# Patient Record
Sex: Female | Born: 2000 | Race: White | Hispanic: No | Marital: Single | Smoking: Never smoker
Health system: Southern US, Community
[De-identification: ages and names within clinical notes are randomized; demographics above are authoritative.]

## PROBLEM LIST (undated history)

## (undated) DIAGNOSIS — B2790 Infectious mononucleosis, unspecified without complication: Secondary | ICD-10-CM

## (undated) DIAGNOSIS — J302 Other seasonal allergic rhinitis: Secondary | ICD-10-CM

## (undated) HISTORY — PX: TONSILLECTOMY: SUR1361

## (undated) HISTORY — DX: Other seasonal allergic rhinitis: J30.2

## (undated) HISTORY — DX: Infectious mononucleosis, unspecified without complication: B27.90

---
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
## 2019-02-18 ENCOUNTER — Encounter: Payer: Self-pay | Admitting: Family Medicine

## 2019-02-18 MED ORDER — CLINDAMYCIN PHOS-BENZOYL PEROX 1-5 % EX GEL: CUTANEOUS | 1 refills | Status: AC

## 2019-04-23 DIAGNOSIS — H6121 Impacted cerumen, right ear: Secondary | ICD-10-CM | POA: Diagnosis not present

## 2019-04-23 DIAGNOSIS — H9191 Unspecified hearing loss, right ear: Secondary | ICD-10-CM | POA: Diagnosis not present

## 2019-06-20 ENCOUNTER — Encounter: Payer: Self-pay | Admitting: Family Medicine

## 2019-07-16 ENCOUNTER — Other Ambulatory Visit: Payer: Self-pay

## 2019-07-17 ENCOUNTER — Encounter: Payer: BLUE CROSS/BLUE SHIELD | Admitting: Family Medicine

## 2019-08-04 ENCOUNTER — Ambulatory Visit: Payer: BC Managed Care – PPO | Attending: Internal Medicine

## 2019-08-04 DIAGNOSIS — Z20822 Contact with and (suspected) exposure to covid-19: Secondary | ICD-10-CM

## 2019-08-07 LAB — NOVEL CORONAVIRUS, NAA: SARS-CoV-2, NAA: NOT DETECTED

## 2019-08-20 ENCOUNTER — Encounter: Payer: Self-pay | Admitting: Family Medicine

## 2020-02-25 DIAGNOSIS — Z0000 Encounter for general adult medical examination without abnormal findings: Secondary | ICD-10-CM | POA: Diagnosis not present

## 2020-04-02 DIAGNOSIS — Z20828 Contact with and (suspected) exposure to other viral communicable diseases: Secondary | ICD-10-CM | POA: Diagnosis not present

## 2020-04-12 DIAGNOSIS — Z20828 Contact with and (suspected) exposure to other viral communicable diseases: Secondary | ICD-10-CM | POA: Diagnosis not present

## 2020-04-15 DIAGNOSIS — L700 Acne vulgaris: Secondary | ICD-10-CM | POA: Diagnosis not present

## 2020-04-15 DIAGNOSIS — Z79899 Other long term (current) drug therapy: Secondary | ICD-10-CM | POA: Diagnosis not present

## 2020-04-19 DIAGNOSIS — Z20828 Contact with and (suspected) exposure to other viral communicable diseases: Secondary | ICD-10-CM | POA: Diagnosis not present

## 2020-04-26 DIAGNOSIS — Z20828 Contact with and (suspected) exposure to other viral communicable diseases: Secondary | ICD-10-CM | POA: Diagnosis not present

## 2020-05-10 DIAGNOSIS — Z20828 Contact with and (suspected) exposure to other viral communicable diseases: Secondary | ICD-10-CM | POA: Diagnosis not present

## 2020-05-17 DIAGNOSIS — Z20828 Contact with and (suspected) exposure to other viral communicable diseases: Secondary | ICD-10-CM | POA: Diagnosis not present

## 2020-05-24 DIAGNOSIS — Z20828 Contact with and (suspected) exposure to other viral communicable diseases: Secondary | ICD-10-CM | POA: Diagnosis not present

## 2020-05-28 DIAGNOSIS — Z79899 Other long term (current) drug therapy: Secondary | ICD-10-CM | POA: Diagnosis not present

## 2020-05-28 DIAGNOSIS — L700 Acne vulgaris: Secondary | ICD-10-CM | POA: Diagnosis not present

## 2020-05-31 DIAGNOSIS — Z20828 Contact with and (suspected) exposure to other viral communicable diseases: Secondary | ICD-10-CM | POA: Diagnosis not present

## 2020-06-07 DIAGNOSIS — Z20828 Contact with and (suspected) exposure to other viral communicable diseases: Secondary | ICD-10-CM | POA: Diagnosis not present

## 2020-07-19 DIAGNOSIS — L700 Acne vulgaris: Secondary | ICD-10-CM | POA: Diagnosis not present

## 2020-07-19 DIAGNOSIS — Z79899 Other long term (current) drug therapy: Secondary | ICD-10-CM | POA: Diagnosis not present

## 2020-08-16 DIAGNOSIS — Z20828 Contact with and (suspected) exposure to other viral communicable diseases: Secondary | ICD-10-CM | POA: Diagnosis not present

## 2020-08-24 DIAGNOSIS — U071 COVID-19: Secondary | ICD-10-CM | POA: Diagnosis not present

## 2020-09-19 IMAGING — DX DG SCOLIOSIS EVAL COMPLETE SPINE 1V
1 series · 2 of 2 positions shown · non-contrast
Comparison: None.

CLINICAL DATA: 18-year-old female with back pain

EXAM:
DG SCOLIOSIS EVAL COMPLETE SPINE 1V

[Series 1: dg scoliosis ap · U · 0.26mm/px · 2 of 2 slices shown]
[im 1/2]
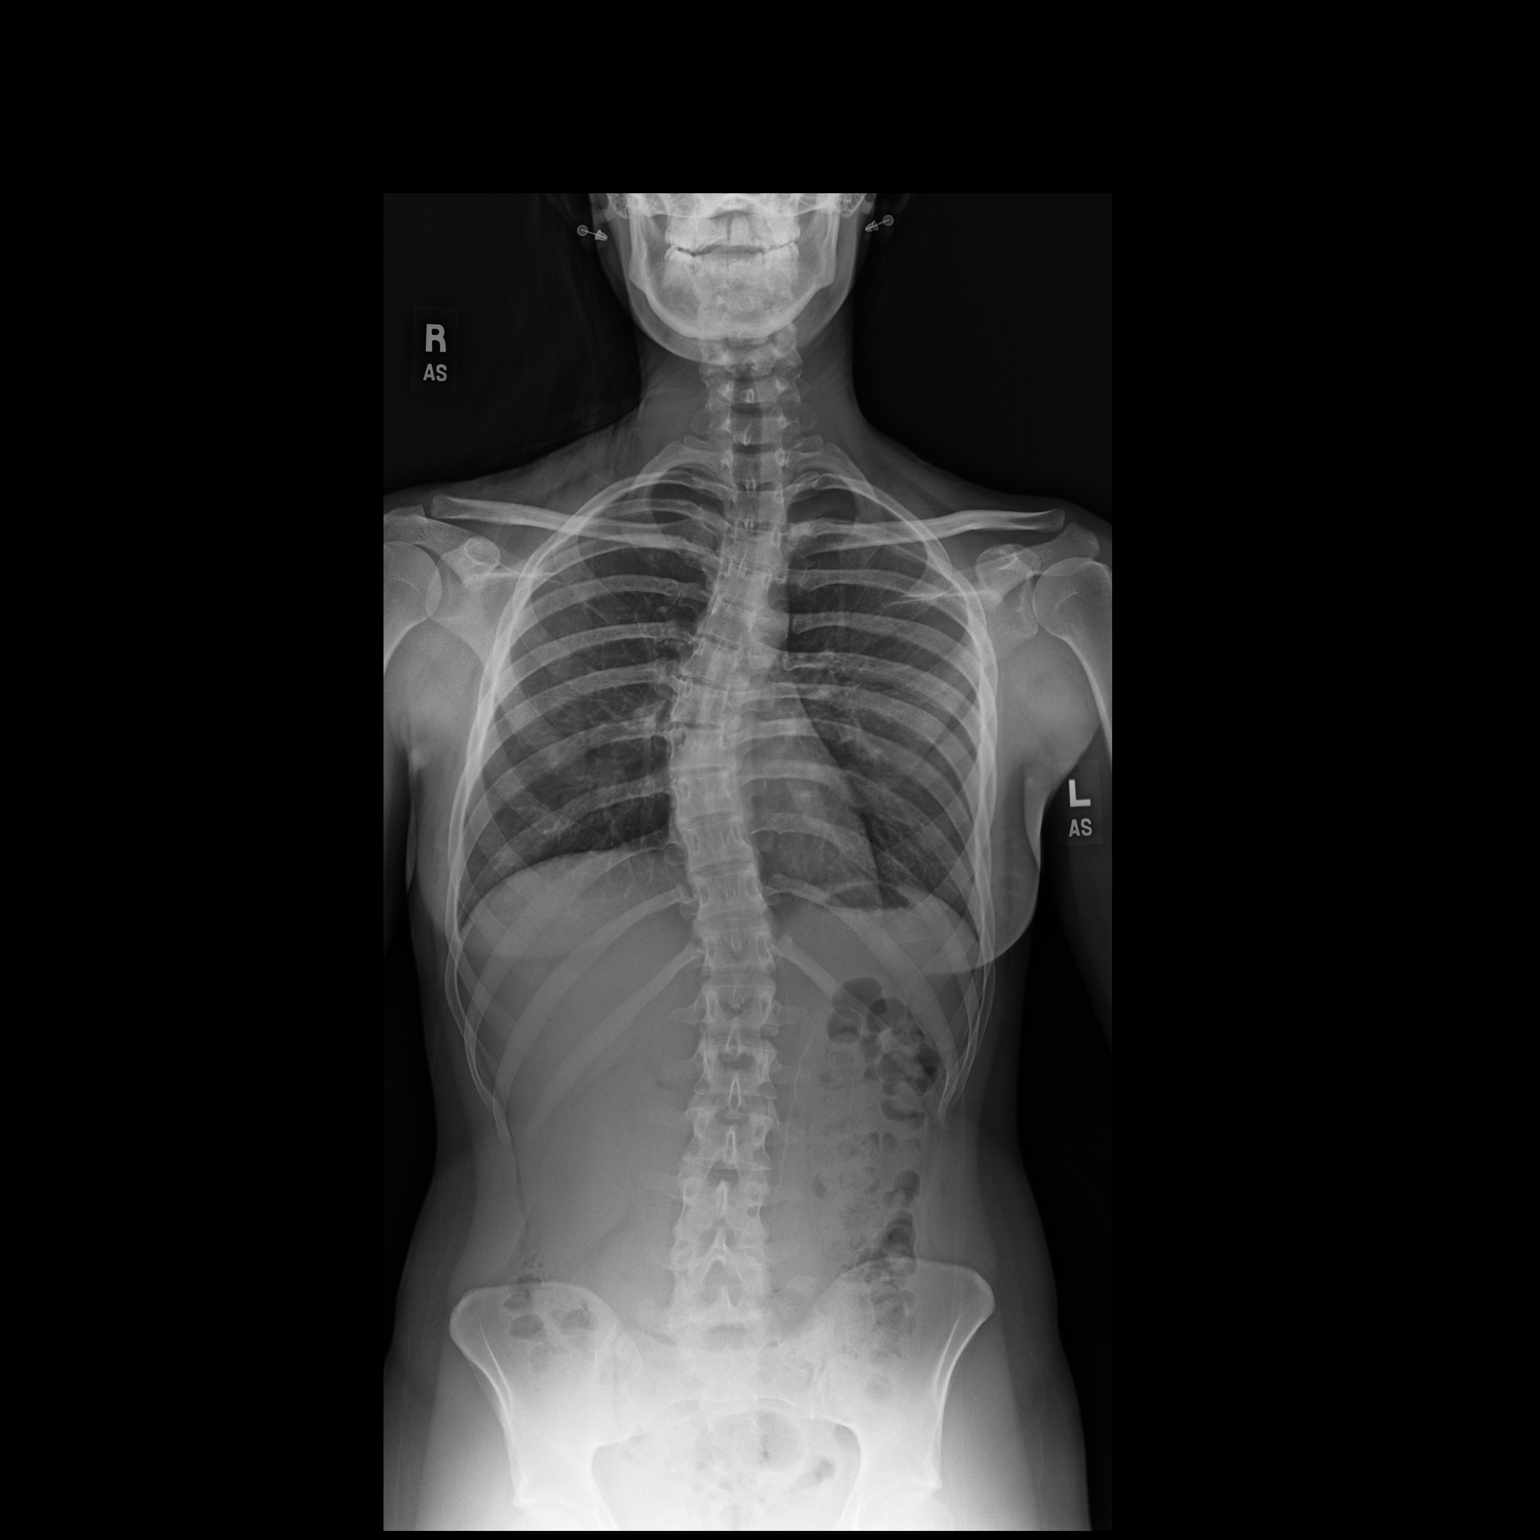
[im 2/2]
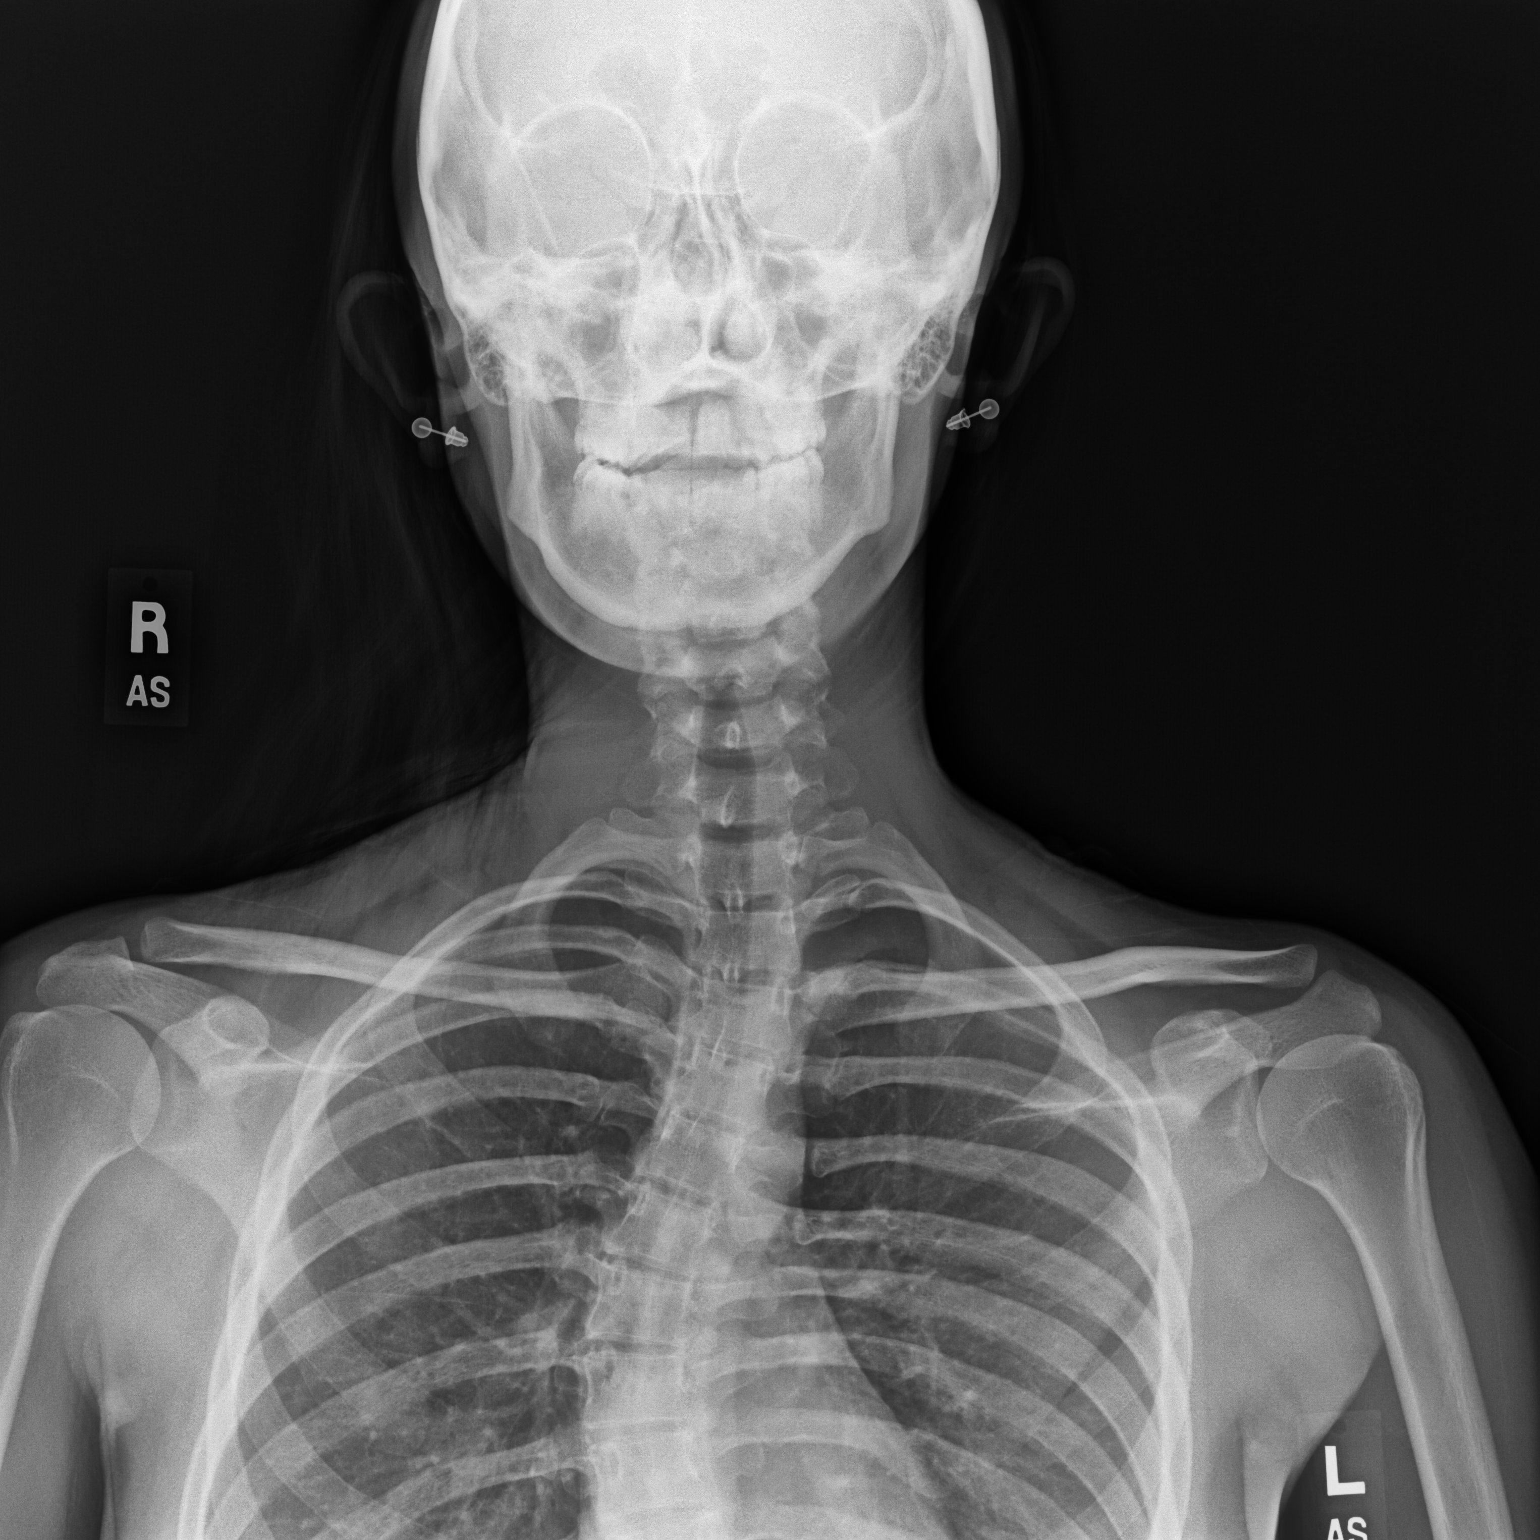

[2 of 2 positions shown; findings below may reference images not displayed]

FINDINGS: Twelve rib-bearing thoracic vertebral bodies. No segmentation
anomaly.

Six lumbar type non rib-bearing vertebral bodies with partial
sacralization of L6 on the left. No segmentation anomaly.

No acute fracture.

Dextroscoliosis of 31 degrees estimated from the inferior endplate
of T4 to the superior endplate of T10.

Compensatory levoscoliosis estimated 18 degrees from the inferior
endplate of T9 to the superior endplate of L3.

Left iliac crest significantly higher than the right.
IMPRESSION: Twelve rib-bearing thoracic vertebral bodies with no segmentation
anomaly.

There are 6 lumbar type non-rib-bearing vertebral bodies with no
segmentation anomaly. Partial sacralization of L6 on the left.

S-shaped scoliosis, as estimated above.

Left iliac crest significantly higher than the right. If there is
concern for leg length discrepancy, office based leg length
measurement may be useful.

## 2020-10-22 DIAGNOSIS — Z79899 Other long term (current) drug therapy: Secondary | ICD-10-CM | POA: Diagnosis not present

## 2020-10-22 DIAGNOSIS — L700 Acne vulgaris: Secondary | ICD-10-CM | POA: Diagnosis not present

## 2021-02-21 DIAGNOSIS — L700 Acne vulgaris: Secondary | ICD-10-CM | POA: Diagnosis not present

## 2021-02-21 DIAGNOSIS — Z79899 Other long term (current) drug therapy: Secondary | ICD-10-CM | POA: Diagnosis not present

## 2022-03-29 ENCOUNTER — Encounter: Payer: Self-pay | Admitting: Internal Medicine
